# Patient Record
Sex: Female | Born: 2000 | Race: Black or African American | Hispanic: No | Marital: Single | Smoking: Current every day smoker
Health system: Southern US, Community
[De-identification: ages and names within clinical notes are randomized; demographics above are authoritative.]

## PROBLEM LIST (undated history)

## (undated) ENCOUNTER — Ambulatory Visit (HOSPITAL_COMMUNITY): Admission: EM | Source: Home / Self Care

## (undated) DIAGNOSIS — F319 Bipolar disorder, unspecified: Secondary | ICD-10-CM

## (undated) DIAGNOSIS — F250 Schizoaffective disorder, bipolar type: Secondary | ICD-10-CM

## (undated) DIAGNOSIS — F259 Schizoaffective disorder, unspecified: Secondary | ICD-10-CM

---
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
## 2021-03-16 ENCOUNTER — Ambulatory Visit
Admission: EM | Admit: 2021-03-16 | Discharge: 2021-03-16 | Disposition: A | Discharge status: Home / Self Care | Payer: Medicaid Other | Attending: Family Medicine | Admitting: Family Medicine

## 2021-03-16 DIAGNOSIS — Z1152 Encounter for screening for COVID-19: Secondary | ICD-10-CM | POA: Diagnosis not present

## 2021-03-16 NOTE — ED Triage Notes (Signed)
Pt presents today for Covid testing; denies symptoms.

## 2021-03-18 LAB — COVID-19, FLU A+B NAA
Influenza A, NAA: NOT DETECTED
Influenza B, NAA: NOT DETECTED
SARS-CoV-2, NAA: NOT DETECTED

## 2021-07-05 ENCOUNTER — Other Ambulatory Visit: Payer: Self-pay

## 2021-07-05 ENCOUNTER — Ambulatory Visit
Admission: EM | Admit: 2021-07-05 | Discharge: 2021-07-05 | Disposition: A | Discharge status: Home / Self Care | Payer: Medicaid Other | Attending: Family Medicine | Admitting: Family Medicine

## 2021-07-05 DIAGNOSIS — R112 Nausea with vomiting, unspecified: Secondary | ICD-10-CM | POA: Insufficient documentation

## 2021-07-05 DIAGNOSIS — Z1152 Encounter for screening for COVID-19: Secondary | ICD-10-CM | POA: Diagnosis not present

## 2021-07-05 DIAGNOSIS — R8290 Unspecified abnormal findings in urine: Secondary | ICD-10-CM | POA: Insufficient documentation

## 2021-07-05 DIAGNOSIS — J069 Acute upper respiratory infection, unspecified: Secondary | ICD-10-CM | POA: Insufficient documentation

## 2021-07-05 LAB — POCT URINALYSIS DIP (MANUAL ENTRY)
Blood, UA: NEGATIVE
Glucose, UA: NEGATIVE mg/dL
Ketones, POC UA: NEGATIVE mg/dL
Nitrite, UA: NEGATIVE
Protein Ur, POC: 100 mg/dL — AB
Spec Grav, UA: >=1.03 — AB (ref 1.010–1.025)
Urobilinogen, UA: 0.2 E.U./dL
pH, UA: 5.5 (ref 5.0–8.0)

## 2021-07-05 LAB — POCT URINE PREGNANCY: Preg Test, Ur: NEGATIVE

## 2021-07-05 MED ORDER — PROMETHAZINE-DM 6.25-15 MG/5ML PO SYRP: 5.0000 mL | ORAL_SOLUTION | Freq: Four times a day (QID) | ORAL | 0 refills | Status: DC | PRN

## 2021-07-05 MED ORDER — ONDANSETRON 4 MG PO TBDP: 4.0000 mg | ORAL_TABLET | Freq: Three times a day (TID) | ORAL | 0 refills | Status: DC | PRN

## 2021-07-05 NOTE — ED Provider Notes (Signed)
[facility]    CSN: [id number] Arrival date & time: 07/05/21  6301      History   Chief Complaint Chief Complaint  Patient presents with   Cough   Emesis    HPI Joan Charles is a 20 y.o. female.   Presenting today with 3-day history of cough, sore throat, generalized abdominal pain, nausea, vomiting, weakness, fever, chills.  Denies chest pain, shortness of breath, diarrhea, rashes.  Multiple sick contacts recently.  So far not taking anything over-the-counter for symptoms.  No pertinent chronic medical problems.   Past Medical History:  Diagnosis Date   Bipolar 1 disorder (HCC)    Schizo affective schizophrenia (HCC)     There are no problems to display for this patient.   History reviewed. No pertinent surgical history.  OB History   No obstetric history on file.      Home Medications    Prior to Admission medications   Medication Sig Start Date End Date Taking? Authorizing Provider  ondansetron (ZOFRAN-ODT) 4 MG disintegrating tablet Take 1 tablet (4 mg total) by mouth every 8 (eight) hours as needed for nausea or vomiting. 07/05/21  Yes Particia Nearing, PA-C  promethazine-dextromethorphan (PROMETHAZINE-DM) 6.25-15 MG/5ML syrup Take 5 mLs by mouth 4 (four) times daily as needed. 07/05/21  Yes Particia Nearing, PA-C    Family History History reviewed. No pertinent family history.  Social History Social History   Tobacco Use   Smoking status: Never   Smokeless tobacco: Never  Substance Use Topics   Alcohol use: No   Drug use: No     Allergies   Patient has no known allergies.   Review of Systems Review of Systems Per HPI  Physical Exam Triage Vital Signs ED Triage Vitals  Enc Vitals Group     BP 07/05/21 0931 118/80     Pulse Rate 07/05/21 0931 94     Resp 07/05/21 0931 20     Temp 07/05/21 0931 99.3 F (37.4 C)     Temp src --      SpO2 07/05/21 0931 98 %     Weight --      Height --      Head  Circumference --      Peak Flow --      Pain Score 07/05/21 0929 4     Pain Loc --      Pain Edu? --      Excl. in GC? --    No data found.  Updated Vital Signs BP 118/80   Pulse 94   Temp 99.3 F (37.4 C)   Resp 20   LMP 06/08/2021   SpO2 98%   Visual Acuity Right Eye Distance:   Left Eye Distance:   Bilateral Distance:    Right Eye Near:   Left Eye Near:    Bilateral Near:     Physical Exam Vitals and nursing note reviewed.  Constitutional:      Appearance: Normal appearance. She is not ill-appearing.  HENT:     Head: Atraumatic.     Nose: Nose normal.     Mouth/Throat:     Mouth: Mucous membranes are moist.     Pharynx: Oropharynx is clear. Posterior oropharyngeal erythema present.  Eyes:     Extraocular Movements: Extraocular movements intact.     Conjunctiva/sclera: Conjunctivae normal.  Cardiovascular:     Rate and Rhythm: Normal rate and regular rhythm.     Heart sounds: Normal heart sounds.  Pulmonary:     Effort: Pulmonary effort is normal.     Breath sounds: Normal breath sounds.  Abdominal:     General: Bowel sounds are normal. There is no distension.     Palpations: Abdomen is soft.     Tenderness: There is abdominal tenderness. There is no right CVA tenderness, left CVA tenderness or guarding.     Comments: Mild generalized tenderness to palpation diffusely without guarding or distention  Musculoskeletal:        General: Normal range of motion.     Cervical back: Normal range of motion and neck supple.  Skin:    General: Skin is warm and dry.  Neurological:     Mental Status: She is alert and oriented to person, place, and time.     Motor: No weakness.     Gait: Gait normal.  Psychiatric:        Mood and Affect: Mood normal.        Thought Content: Thought content normal.        Judgment: Judgment normal.   UC Treatments / Results  Labs (all labs ordered are listed, but only abnormal results are displayed) Labs Reviewed  POCT  URINALYSIS DIP (MANUAL ENTRY) - Abnormal; Notable for the following components:      Result Value   Clarity, UA cloudy (*)    Bilirubin, UA small (*)    Spec Grav, UA >=1.030 (*)    Protein Ur, POC =100 (*)    Leukocytes, UA Small (1+) (*)    All other components within normal limits  COVID-19, FLU A+B NAA  URINE CULTURE  POCT URINE PREGNANCY    EKG  Radiology No results found.  Procedures Procedures (including critical care time)  Medications Ordered in UC Medications - No data to display  Initial Impression / Assessment and Plan / UC Course  I have reviewed the triage vital signs and the nursing notes.  Pertinent labs & imaging results that were available during my care of the patient were reviewed by me and considered in my medical decision making (see chart for details).     Vital signs and exam reassuring, urinalysis without indication of a urinary tract infection more so reflecting dehydration to be expected with nausea and vomiting.  Suspect viral illness, likely influenza causing symptoms.  We will treat symptomatically with Zofran, Phenergan DM for cough, brat diet, push fluids while awaiting COVID and flu testing.  School note given and strict return precautions reviewed.  Final Clinical Impressions(s) / UC Diagnoses   Final diagnoses:  Abnormal urinalysis  Viral URI with cough  Nausea and vomiting, unspecified vomiting type   Discharge Instructions   None    ED Prescriptions     Medication Sig Dispense Auth. Provider   ondansetron (ZOFRAN-ODT) 4 MG disintegrating tablet Take 1 tablet (4 mg total) by mouth every 8 (eight) hours as needed for nausea or vomiting. 20 tablet Particia Nearing, [place]   promethazine-dextromethorphan (PROMETHAZINE-DM) 6.25-15 MG/5ML syrup Take 5 mLs by mouth 4 (four) times daily as needed. 100 mL Particia Nearing, [place]      PDMP not reviewed this encounter.   Particia Nearing, [place] 07/05/21 1037

## 2021-07-05 NOTE — ED Triage Notes (Signed)
Pt presents with cough, sore throat and vomiting that began on Friday

## 2021-07-06 LAB — COVID-19, FLU A+B NAA
Influenza A, NAA: NOT DETECTED
Influenza B, NAA: NOT DETECTED
SARS-CoV-2, NAA: NOT DETECTED

## 2021-07-06 LAB — URINE CULTURE: Culture: <10000 — AB

## 2021-10-06 ENCOUNTER — Ambulatory Visit
Admission: EM | Admit: 2021-10-06 | Discharge: 2021-10-06 | Disposition: A | Discharge status: Home / Self Care | Payer: Medicaid Other | Attending: Family Medicine | Admitting: Family Medicine

## 2021-10-06 ENCOUNTER — Encounter: Payer: Self-pay | Admitting: Emergency Medicine

## 2021-10-06 ENCOUNTER — Other Ambulatory Visit: Payer: Self-pay

## 2021-10-06 DIAGNOSIS — M25562 Pain in left knee: Secondary | ICD-10-CM

## 2021-10-06 MED ORDER — CYCLOBENZAPRINE HCL 5 MG PO TABS: 5.0000 mg | ORAL_TABLET | Freq: Three times a day (TID) | ORAL | 0 refills | Status: DC | PRN

## 2021-10-06 MED ORDER — NAPROXEN 500 MG PO TABS: 500.0000 mg | ORAL_TABLET | Freq: Two times a day (BID) | ORAL | 0 refills | Status: DC | PRN

## 2021-10-06 NOTE — ED Triage Notes (Signed)
Left leg/knee pain pain, unable to bend leg.  No known injury.  Pain since Sunday.   ?

## 2021-10-06 NOTE — ED Provider Notes (Signed)
?[facility] ? ? ? ?CSN: [id number] ?Arrival date & time: 10/06/21  0906 ? ? ?  ? ?History   ?Chief Complaint ?No chief complaint on file. ? ? ?HPI ?Joan Charles is a 21 y.o. female.  ? ?Presenting today with 3 to 4 days of left leg pain just above the knee.  Some pain radiating laterally toward back of knee as well.  States bending the leg is very painful and very painful to bear weight on the leg.  Denies swelling, redness, known injury to the area, numbness, tingling, weakness.  So far not tried anything over-the-counter for symptoms. ? ? ?Past Medical History:  ?Diagnosis Date  ? Bipolar 1 disorder (HCC)   ? Schizo affective schizophrenia (HCC)   ? ? ?There are no problems to display for this patient. ? ? ?History reviewed. No pertinent surgical history. ? ?OB History   ?No obstetric history on file. ?  ? ? ? ?Home Medications   ? ?Prior to Admission medications   ?Medication Sig Start Date End Date Taking? Authorizing Provider  ?cyclobenzaprine (FLEXERIL) 5 MG tablet Take 1 tablet (5 mg total) by mouth 3 (three) times daily as needed for muscle spasms. Do not drink alcohol or drive while taking this medication.  May cause drowsiness. 10/06/21  Yes Particia Nearing, PA-C  ?naproxen (NAPROSYN) 500 MG tablet Take 1 tablet (500 mg total) by mouth 2 (two) times daily as needed. 10/06/21  Yes Particia Nearing, PA-C  ?ondansetron (ZOFRAN-ODT) 4 MG disintegrating tablet Take 1 tablet (4 mg total) by mouth every 8 (eight) hours as needed for nausea or vomiting. 07/05/21   Particia Nearing, PA-C  ?promethazine-dextromethorphan (PROMETHAZINE-DM) 6.25-15 MG/5ML syrup Take 5 mLs by mouth 4 (four) times daily as needed. 07/05/21   Particia Nearing, PA-C  ? ? ?Family History ?History reviewed. No pertinent family history. ? ?Social History ?Social History  ? ?Tobacco Use  ? Smoking status: Every Day  ?  Types: Cigarettes  ? Smokeless tobacco: Never  ?Substance Use Topics  ? Alcohol use: No  ?  Drug use: No  ? ?Allergies   ?Patient has no known allergies. ? ?Review of Systems ?Review of Systems ?Per HPI ? ?Physical Exam ?Triage Vital Signs ?ED Triage Vitals  ?Enc Vitals Group  ?   BP 10/06/21 1210 119/78  ?   Pulse Rate 10/06/21 1210 98  ?   Resp 10/06/21 1210 18  ?   Temp 10/06/21 1210 98.1 ?F (36.7 ?C)  ?   Temp Source 10/06/21 1201 Oral  ?   SpO2 10/06/21 1210 98 %  ?   Weight --   ?   Height --   ?   Head Circumference --   ?   Peak Flow --   ?   Pain Score 10/06/21 1044 10  ?   Pain Loc --   ?   Pain Edu? --   ?   Excl. in GC? --   ? ?No data found. ? ?Updated Vital Signs ?BP 119/78 (BP Location: Right Arm)   Pulse 98   Temp 98.1 ?F (36.7 ?C) (Oral)   Resp 18   LMP 09/25/2021 (Exact Date)   SpO2 98%  ? ?Visual Acuity ?Right Eye Distance:   ?Left Eye Distance:   ?Bilateral Distance:   ? ?Right Eye Near:   ?Left Eye Near:    ?Bilateral Near:    ? ?Physical Exam ?Vitals and nursing note reviewed.  ?Constitutional:   ?  Appearance: Normal appearance. She is not ill-appearing.  ?HENT:  ?   Head: Atraumatic.  ?Eyes:  ?   Extraocular Movements: Extraocular movements intact.  ?   Conjunctiva/sclera: Conjunctivae normal.  ?Cardiovascular:  ?   Rate and Rhythm: Normal rate and regular rhythm.  ?   Heart sounds: Normal heart sounds.  ?Pulmonary:  ?   Effort: Pulmonary effort is normal.  ?   Breath sounds: Normal breath sounds.  ?Musculoskeletal:     ?   General: Tenderness present. No swelling, deformity or signs of injury. Normal range of motion.  ?   Cervical back: Normal range of motion and neck supple.  ?   Comments: Tender to palpation distal insertion of the quadricep muscles left leg.  Mild bilateral joint line tenderness to palpation but no swelling, deformity, crepitus.  Range of motion exam limited due to patient's discomfort.  No significant posterior knee tenderness, swelling and negative squeeze test, negative Denna Haggard' sign  ?Skin: ?   General: Skin is warm and dry.  ?   Findings: No bruising  or erythema.  ?Neurological:  ?   Mental Status: She is alert and oriented to person, place, and time.  ?   Comments: Left lower extremity neurovascularly intact  ?Psychiatric:     ?   Mood and Affect: Mood normal.     ?   Thought Content: Thought content normal.     ?   Judgment: Judgment normal.  ? ?UC Treatments / Results  ?Labs ?(all labs ordered are listed, but only abnormal results are displayed) ?Labs Reviewed - No data to display ? ?EKG ? ?Radiology ?No results found. ? ?Procedures ?Procedures (including critical care time) ? ?Medications Ordered in UC ?Medications - No data to display ? ?Initial Impression / Assessment and Plan / UC Course  ?I have reviewed the triage vital signs and the nursing notes. ? ?Pertinent labs & imaging results that were available during my care of the patient were reviewed by me and considered in my medical decision making (see chart for details). ? ?  ? ?Suspect muscular strain, treat with naproxen, Flexeril, compression sleeve to the knee and rest.  Work note and school note given.  Return for any acutely worsening symptoms. ? ?Final Clinical Impressions(s) / UC Diagnoses  ? ?Final diagnoses:  ?Acute pain of left knee  ? ?Discharge Instructions   ?None ?  ? ?ED Prescriptions   ? ? Medication Sig Dispense Auth. Provider  ? naproxen (NAPROSYN) 500 MG tablet Take 1 tablet (500 mg total) by mouth 2 (two) times daily as needed. 20 tablet Particia Nearing, [place]  ? cyclobenzaprine (FLEXERIL) 5 MG tablet Take 1 tablet (5 mg total) by mouth 3 (three) times daily as needed for muscle spasms. Do not drink alcohol or drive while taking this medication.  May cause drowsiness. 10 tablet Particia Nearing, [place]  ? ?  ? ?PDMP not reviewed this encounter. ?  ?Particia Nearing, PA-C ?10/06/21 1319 ? ?

## 2023-06-16 ENCOUNTER — Ambulatory Visit
Admission: EM | Admit: 2023-06-16 | Discharge: 2023-06-16 | Disposition: A | Discharge status: Home / Self Care | Payer: Medicaid Other | Attending: Nurse Practitioner | Admitting: Nurse Practitioner

## 2023-06-16 ENCOUNTER — Ambulatory Visit: Payer: Self-pay

## 2023-06-16 DIAGNOSIS — J029 Acute pharyngitis, unspecified: Secondary | ICD-10-CM | POA: Diagnosis not present

## 2023-06-16 DIAGNOSIS — B349 Viral infection, unspecified: Secondary | ICD-10-CM | POA: Diagnosis not present

## 2023-06-16 LAB — POCT RAPID STREP A (OFFICE): Rapid Strep A Screen: NEGATIVE

## 2023-06-16 MED ORDER — LIDOCAINE VISCOUS HCL 2 % MT SOLN: 15.0000 mL | OROMUCOSAL | 0 refills | Status: DC | PRN

## 2023-06-16 NOTE — ED Provider Notes (Signed)
[facility]    CSN: [id number] Arrival date & time: 06/16/23  3474      History   Chief Complaint No chief complaint on file.   HPI Joan Charles is a 22 y.o. female.   Patient presents today with 1 day history of nasal congestion, postnasal drainage, sore throat, headache, and left ear pain.  Also endorses fatigue.  No fever, cough, shortness of breath, chest pain, runny nose, ear drainage, decreased hearing from the ears, abdominal pain, nausea/vomiting, diarrhea.  No change in appetite or loss of taste/smell.  Reports her nephews are sick with similar symptoms.  Has not taken anything for symptoms so far.    Past Medical History:  Diagnosis Date   Bipolar 1 disorder (HCC)    Schizo affective schizophrenia (HCC)     There are no problems to display for this patient.   History reviewed. No pertinent surgical history.  OB History   No obstetric history on file.      Home Medications    Prior to Admission medications   Medication Sig Start Date End Date Taking? Authorizing Provider  lidocaine (XYLOCAINE) 2 % solution Use as directed 15 mLs in the mouth or throat as needed for mouth pain. 06/16/23  Yes Valentino Nose, NP  cyclobenzaprine (FLEXERIL) 5 MG tablet Take 1 tablet (5 mg total) by mouth 3 (three) times daily as needed for muscle spasms. Do not drink alcohol or drive while taking this medication.  May cause drowsiness. 10/06/21   Particia Nearing, PA-C  naproxen (NAPROSYN) 500 MG tablet Take 1 tablet (500 mg total) by mouth 2 (two) times daily as needed. 10/06/21   Particia Nearing, PA-C  ondansetron (ZOFRAN-ODT) 4 MG disintegrating tablet Take 1 tablet (4 mg total) by mouth every 8 (eight) hours as needed for nausea or vomiting. 07/05/21   Particia Nearing, PA-C    Family History History reviewed. No pertinent family history.  Social History Social History   Tobacco Use   Smoking status: Every Day    Types: Cigarettes    Smokeless tobacco: Never  Substance Use Topics   Alcohol use: No   Drug use: No     Allergies   Patient has no known allergies.   Review of Systems Review of Systems Per HPI  Physical Exam Triage Vital Signs ED Triage Vitals  Encounter Vitals Group     BP 06/16/23 1005 107/69     Systolic BP Percentile --      Diastolic BP Percentile --      Pulse Rate 06/16/23 1005 80     Resp 06/16/23 1005 14     Temp 06/16/23 1005 98.4 F (36.9 C)     Temp Source 06/16/23 1005 Oral     SpO2 06/16/23 1005 98 %     Weight --      Height --      Head Circumference --      Peak Flow --      Pain Score 06/16/23 1006 10     Pain Loc --      Pain Education --      Exclude from Growth Chart --    No data found.  Updated Vital Signs BP 107/69 (BP Location: Right Arm)   Pulse 80   Temp 98.4 F (36.9 C) (Oral)   Resp 14   LMP 06/10/2023 Comment: start  SpO2 98%   Visual Acuity Right Eye Distance:   Left Eye Distance:  Bilateral Distance:    Right Eye Near:   Left Eye Near:    Bilateral Near:     Physical Exam Vitals and nursing note reviewed.  Constitutional:      General: She is not in acute distress.    Appearance: Normal appearance. She is not ill-appearing or toxic-appearing.  HENT:     Head: Normocephalic and atraumatic.     Right Ear: Tympanic membrane, ear canal and external ear normal.     Left Ear: Tympanic membrane, ear canal and external ear normal.     Nose: No congestion or rhinorrhea.     Mouth/Throat:     Mouth: Mucous membranes are moist.     Pharynx: Oropharynx is clear. No oropharyngeal exudate or posterior oropharyngeal erythema.     Comments: Post nasal drainage Eyes:     General: No scleral icterus.    Extraocular Movements: Extraocular movements intact.  Cardiovascular:     Rate and Rhythm: Normal rate and regular rhythm.  Pulmonary:     Effort: Pulmonary effort is normal. No respiratory distress.     Breath sounds: Normal breath sounds. No  wheezing, rhonchi or rales.  Musculoskeletal:     Cervical back: Normal range of motion and neck supple.  Lymphadenopathy:     Cervical: No cervical adenopathy.  Skin:    General: Skin is warm and dry.     Coloration: Skin is not jaundiced or pale.     Findings: No erythema or rash.  Neurological:     Mental Status: She is alert and oriented to person, place, and time.  Psychiatric:        Behavior: Behavior is cooperative.      UC Treatments / Results  Labs (all labs ordered are listed, but only abnormal results are displayed) Labs Reviewed  POCT RAPID STREP A (OFFICE)    EKG   Radiology No results found.  Procedures Procedures (including critical care time)  Medications Ordered in UC Medications - No data to display  Initial Impression / Assessment and Plan / UC Course  I have reviewed the triage vital signs and the nursing notes.  Pertinent labs & imaging results that were available during my care of the patient were reviewed by me and considered in my medical decision making (see chart for details).   Patient is well-appearing, normotensive, afebrile, not tachycardic, not tachypneic, oxygenating well on room air.    1. Viral illness 2. Viral pharyngitis Rapid strep negative, throat culture deferred given exam Patient declines viral testing today Supportive care discussed, start lidocaine rinses, other supportive care Return and ER precautions discussed with patient  The patient was given the opportunity to ask questions.  All questions answered to their satisfaction.  The patient is in agreement to this plan.    Final Clinical Impressions(s) / UC Diagnoses   Final diagnoses:  Viral illness  Viral pharyngitis     Discharge Instructions      Rapid strep throat test is negative.  You have a viral upper respiratory infection.  Symptoms should improve over the next week to 10 days.  If you develop chest pain or shortness of breath, go to the emergency  room.  Some things that can make you feel better are: - Increased rest - Increasing fluid with water/sugar free electrolytes - Acetaminophen and ibuprofen as needed for fever/pain - Salt water gargling, chloraseptic spray and throat lozenges - OTC guaifenesin (Mucinex) 600 mg twice daily for congestion - Saline sinus flushes or a  neti pot for congestion - Humidifying the air - Lidocaine rinses for sore throat     ED Prescriptions     Medication Sig Dispense Auth. Provider   lidocaine (XYLOCAINE) 2 % solution Use as directed 15 mLs in the mouth or throat as needed for mouth pain. 100 mL Valentino Nose, NP      PDMP not reviewed this encounter.   Valentino Nose, NP 06/16/23 1025

## 2023-06-16 NOTE — Discharge Instructions (Signed)
Rapid strep throat test is negative.  You have a viral upper respiratory infection.  Symptoms should improve over the next week to 10 days.  If you develop chest pain or shortness of breath, go to the emergency room.  Some things that can make you feel better are: - Increased rest - Increasing fluid with water/sugar free electrolytes - Acetaminophen and ibuprofen as needed for fever/pain - Salt water gargling, chloraseptic spray and throat lozenges - OTC guaifenesin (Mucinex) 600 mg twice daily for congestion - Saline sinus flushes or a neti pot for congestion - Humidifying the air - Lidocaine rinses for sore throat

## 2023-06-16 NOTE — ED Triage Notes (Signed)
Pt report throat pai with mucus, nasal congestion, and left side ear pain x 1 day Hurts to swallow

## 2023-08-23 ENCOUNTER — Encounter: Payer: Self-pay | Admitting: Emergency Medicine

## 2023-08-23 ENCOUNTER — Ambulatory Visit
Admission: EM | Admit: 2023-08-23 | Discharge: 2023-08-23 | Disposition: A | Discharge status: Home / Self Care | Payer: Medicaid Other | Attending: Family Medicine | Admitting: Family Medicine

## 2023-08-23 DIAGNOSIS — N926 Irregular menstruation, unspecified: Secondary | ICD-10-CM | POA: Insufficient documentation

## 2023-08-23 DIAGNOSIS — N898 Other specified noninflammatory disorders of vagina: Secondary | ICD-10-CM | POA: Diagnosis present

## 2023-08-23 DIAGNOSIS — Z3202 Encounter for pregnancy test, result negative: Secondary | ICD-10-CM | POA: Insufficient documentation

## 2023-08-23 LAB — POCT URINALYSIS DIP (MANUAL ENTRY)
Bilirubin, UA: NEGATIVE
Blood, UA: NEGATIVE
Glucose, UA: NEGATIVE mg/dL
Ketones, POC UA: NEGATIVE mg/dL
Leukocytes, UA: NEGATIVE
Nitrite, UA: NEGATIVE
Protein Ur, POC: 30 mg/dL — AB
Spec Grav, UA: >=1.03 — AB (ref 1.010–1.025)
Urobilinogen, UA: 1 U/dL
pH, UA: 6.5 (ref 5.0–8.0)

## 2023-08-23 LAB — POCT URINE PREGNANCY: Preg Test, Ur: NEGATIVE

## 2023-08-23 NOTE — ED Provider Notes (Signed)
 [facility]    CSN: [id number] Arrival date & time: 08/23/23  1017      History   Chief Complaint No chief complaint on file.   HPI Joan Charles is a 23 y.o. female.   Patient presenting today for evaluation of missed menstrual cycle.  States her menstrual cycle was supposed to start 2 days ago but did not come.  States her periods are typically on time or 1 day early and she does not typically miss a menstrual cycle.  She is not currently on any birth control or hormonal supplementation and is sexually active.  Does have some vaginal discharge and cramping but otherwise no symptoms currently.      Past Medical History:  Diagnosis Date   Bipolar 1 disorder (HCC)    Schizo affective schizophrenia (HCC)     There are no active problems to display for this patient.   History reviewed. No pertinent surgical history.  OB History   No obstetric history on file.      Home Medications    Prior to Admission medications   Not on File    Family History History reviewed. No pertinent family history.  Social History Social History   Tobacco Use   Smoking status: Every Day    Types: Cigarettes   Smokeless tobacco: Never  Substance Use Topics   Alcohol use: No   Drug use: No     Allergies   Patient has no known allergies.   Review of Systems Review of Systems Per HPI  Physical Exam Triage Vital Signs ED Triage Vitals  Encounter Vitals Group     BP 08/23/23 1024 122/68     Systolic BP Percentile --      Diastolic BP Percentile --      Pulse Rate 08/23/23 1024 89     Resp 08/23/23 1024 18     Temp 08/23/23 1024 98.7 F (37.1 C)     Temp Source 08/23/23 1024 Oral     SpO2 08/23/23 1024 97 %     Weight --      Height --      Head Circumference --      Peak Flow --      Pain Score 08/23/23 1025 6     Pain Loc --      Pain Education --      Exclude from Growth Chart --    No data found.  Updated Vital Signs BP 122/68 (BP  Location: Right Arm)   Pulse 89   Temp 98.7 F (37.1 C) (Oral)   Resp 18   LMP 07/28/2023 (Exact Date)   SpO2 97%   Visual Acuity Right Eye Distance:   Left Eye Distance:   Bilateral Distance:    Right Eye Near:   Left Eye Near:    Bilateral Near:     Physical Exam Vitals and nursing note reviewed.  Constitutional:      Appearance: Normal appearance. She is not ill-appearing.  HENT:     Head: Atraumatic.  Eyes:     Extraocular Movements: Extraocular movements intact.     Conjunctiva/sclera: Conjunctivae normal.  Cardiovascular:     Rate and Rhythm: Normal rate and regular rhythm.     Heart sounds: Normal heart sounds.  Pulmonary:     Effort: Pulmonary effort is normal.     Breath sounds: Normal breath sounds.  Abdominal:     General: Bowel sounds are normal. There is no distension.  Palpations: Abdomen is soft.     Tenderness: There is no abdominal tenderness. There is no right CVA tenderness, left CVA tenderness or guarding.  Genitourinary:    Comments: GU exam deferred, self swab performed Musculoskeletal:        General: Normal range of motion.     Cervical back: Normal range of motion and neck supple.  Skin:    General: Skin is warm and dry.  Neurological:     Mental Status: She is alert and oriented to person, place, and time.  Psychiatric:        Mood and Affect: Mood normal.        Thought Content: Thought content normal.        Judgment: Judgment normal.      UC Treatments / Results  Labs (all labs ordered are listed, but only abnormal results are displayed) Labs Reviewed  POCT URINALYSIS DIP (MANUAL ENTRY) - Abnormal; Notable for the following components:      Result Value   Spec Grav, UA >=1.030 (*)    Protein Ur, POC =30 (*)    All other components within normal limits  POCT URINE PREGNANCY  CERVICOVAGINAL ANCILLARY ONLY    EKG   Radiology No results found.  Procedures Procedures (including critical care time)  Medications  Ordered in UC Medications - No data to display  Initial Impression / Assessment and Plan / UC Course  I have reviewed the triage vital signs and the nursing notes.  Pertinent labs & imaging results that were available during my care of the patient were reviewed by me and considered in my medical decision making (see chart for details).     Vitals and exam reassuring, urine pregnancy negative, urinalysis negative for UTI.  Vaginal swab pending for further evaluation of possible vaginal infections but did discuss that multiple factors, such as sleep, stress, recent illness can also affect menstrual cycle timing.  Follow-up with OB/GYN if not resolving.  Final Clinical Impressions(s) / UC Diagnoses   Final diagnoses:  Missed period  Vaginal discharge  Negative pregnancy test   Discharge Instructions   None    ED Prescriptions   None    PDMP not reviewed this encounter.   Corbin Dess, [place] 08/23/23 1132

## 2023-08-23 NOTE — ED Triage Notes (Signed)
 States she had a missed period and wants to be checked to see why it was missed.  States period was supposed to start on Monday.  States she is having some lower abd cramping.

## 2023-08-24 ENCOUNTER — Telehealth (HOSPITAL_COMMUNITY): Payer: Self-pay

## 2023-08-24 LAB — CERVICOVAGINAL ANCILLARY ONLY
Bacterial Vaginitis (gardnerella): POSITIVE — AB
Candida Glabrata: NEGATIVE
Candida Vaginitis: NEGATIVE
Comment: NEGATIVE
Comment: NEGATIVE
Comment: NEGATIVE

## 2023-08-24 MED ORDER — METRONIDAZOLE 500 MG PO TABS: 500.0000 mg | ORAL_TABLET | Freq: Two times a day (BID) | ORAL | 0 refills | Status: DC

## 2023-08-24 NOTE — Telephone Encounter (Signed)
 Per protocol, pt requires tx with metronidazole. Attempted to reach patient x1. LVM. Rx sent to pharmacy on file.

## 2023-12-28 ENCOUNTER — Encounter (HOSPITAL_COMMUNITY): Payer: Self-pay | Admitting: Emergency Medicine

## 2023-12-28 ENCOUNTER — Other Ambulatory Visit: Payer: Self-pay

## 2023-12-28 ENCOUNTER — Emergency Department (HOSPITAL_COMMUNITY)
Admission: EM | Admit: 2023-12-28 | Discharge: 2023-12-28 | Discharge status: Against Medical Advice | Attending: Emergency Medicine | Admitting: Emergency Medicine

## 2023-12-28 DIAGNOSIS — Z5321 Procedure and treatment not carried out due to patient leaving prior to being seen by health care provider: Secondary | ICD-10-CM | POA: Insufficient documentation

## 2023-12-28 DIAGNOSIS — K0889 Other specified disorders of teeth and supporting structures: Secondary | ICD-10-CM | POA: Diagnosis present

## 2023-12-28 NOTE — ED Triage Notes (Signed)
 Pt c/o right sided dental pain x a couple days.

## 2023-12-28 NOTE — ED Notes (Signed)
Patient stated she was leaving. 

## 2024-02-28 ENCOUNTER — Ambulatory Visit

## 2024-03-07 ENCOUNTER — Ambulatory Visit: Payer: Self-pay | Admitting: Family Medicine

## 2024-04-26 ENCOUNTER — Encounter (HOSPITAL_COMMUNITY): Payer: Self-pay

## 2024-04-26 ENCOUNTER — Ambulatory Visit (HOSPITAL_COMMUNITY)
Admission: EM | Admit: 2024-04-26 | Discharge: 2024-04-26 | Disposition: A | Discharge status: Home / Self Care | Attending: Emergency Medicine | Admitting: Emergency Medicine

## 2024-04-26 DIAGNOSIS — M25462 Effusion, left knee: Secondary | ICD-10-CM

## 2024-04-26 MED ORDER — PREDNISONE 20 MG PO TABS
40.0000 mg | ORAL_TABLET | Freq: Once | ORAL | Status: AC
Start: 2024-04-26 — End: 2024-04-26
  Administered 2024-04-26: 40 mg via ORAL

## 2024-04-26 MED ORDER — PREDNISONE 20 MG PO TABS
ORAL_TABLET | ORAL | Status: AC
  Filled 2024-04-26: qty 2

## 2024-04-26 MED ORDER — PREDNISONE 20 MG PO TABS: 40.0000 mg | ORAL_TABLET | Freq: Every day | ORAL | 0 refills | Status: AC

## 2024-04-26 NOTE — ED Provider Notes (Signed)
 [facility]    CSN: [id number] Arrival date & time: 04/26/24  1106      History   Chief Complaint Chief Complaint  Patient presents with   Joint Swelling    HPI Joan Charles is a 23 y.o. female.  Here with 2 day history of left knee pain, swelling Reports it started after she had bent her legs while sitting in bed No other injury, trauma, fall Used ibuprofen  and tylenol yesterday, no meds today  Denies prior knee injuries or issues  Past Medical History:  Diagnosis Date   Bipolar 1 disorder (HCC)    Schizo affective schizophrenia (HCC)     There are no active problems to display for this patient.   History reviewed. No pertinent surgical history.  OB History   No obstetric history on file.      Home Medications    Prior to Admission medications   Medication Sig Start Date End Date Taking? Authorizing Provider  predniSONE  (DELTASONE ) 20 MG tablet Take 2 tablets (40 mg total) by mouth daily with breakfast for 4 days. Start tomorrow on 04/27/2024 04/27/24 05/01/24 Yes Sherial Ebrahim, [place] RIGGERS    Family History History reviewed. No pertinent family history.  Social History Social History   Tobacco Use   Smoking status: Every Day    Types: Cigarettes   Smokeless tobacco: Never  Vaping Use   Vaping status: Never Used  Substance Use Topics   Alcohol use: No   Drug use: No     Allergies   Patient has no known allergies.   Review of Systems Review of Systems  As per HPI  Physical Exam Triage Vital Signs ED Triage Vitals [04/26/24 1130]  Encounter Vitals Group     BP 117/75     Girls Systolic BP Percentile      Girls Diastolic BP Percentile      Boys Systolic BP Percentile      Boys Diastolic BP Percentile      Pulse Rate 86     Resp 16     Temp 98.4 F (36.9 C)     Temp Source Oral     SpO2 96 %     Weight      Height      Head Circumference      Peak Flow      Pain Score 10     Pain Loc      Pain Education      Exclude  from Growth Chart    No data found.  Updated Vital Signs BP 117/75 (BP Location: Right Arm)   Pulse 86   Temp 98.4 F (36.9 C) (Oral)   Resp 16   LMP 04/21/2024 (Approximate)   SpO2 96%    Physical Exam Vitals and nursing note reviewed.  Constitutional:      General: She is not in acute distress. HENT:     Mouth/Throat:     Pharynx: Oropharynx is clear.  Cardiovascular:     Rate and Rhythm: Normal rate and regular rhythm.     Pulses: Normal pulses.     Heart sounds: Normal heart sounds.  Pulmonary:     Effort: Pulmonary effort is normal.     Breath sounds: Normal breath sounds.  Musculoskeletal:     Cervical back: Normal range of motion.     Right knee: Normal.     Left knee: Swelling and effusion present. No bony tenderness or crepitus. Normal range of motion. Tenderness present. Normal  pulse.     Comments: 2+ swelling left knee. No erythema or warmth. Good ROM despite pain, can extend fully and flex to 90 degrees. Flexion causes most pain. Generalized tenderness. No pain with patellar movement, no crepitus. Distal sensation intact. DP pulses 2+. Normal ROM of ankles. No posterior calf tenderness.   Skin:    Capillary Refill: Capillary refill takes less than 2 seconds.  Neurological:     Mental Status: She is alert and oriented to person, place, and time.     UC Treatments / Results  Labs (all labs ordered are listed, but only abnormal results are displayed) Labs Reviewed - No data to display  EKG  Radiology No results found.  Procedures Procedures   Medications Ordered in UC Medications  predniSONE  (DELTASONE ) tablet 40 mg (has no administration in time range)    Initial Impression / Assessment and Plan / UC Course  I have reviewed the triage vital signs and the nursing notes.  Pertinent labs & imaging results that were available during my care of the patient were reviewed by me and considered in my medical decision making (see chart for  details).  Left knee swelling, pain No direct injury or trauma to warrant xray imaging at this time Good ROM, no warmth or erythema, not concerned for infection at this time. Suspect more likely bursitis. Advised RICE therapy and pain control. Declines IM steroid in clinic. Will do 5 day prednisone  burst, first dose given today in clinic. Knee brace provided. Recommend to follow up with orthopedics if symptoms persist Agrees to plan, no questions  Final Clinical Impressions(s) / UC Diagnoses   Final diagnoses:  Swelling of left knee     Discharge Instructions      We gave you your first prednisone  dose in the clinic today.  Starting tomorrow, finish taking 2 pills daily for 4 more days in a row. Do not use any Advil /ibuprofen  while taking the prednisone . You CAN use tylenol  Apply ice for 20 minutes at a time, a few times daily Wear knee brace for compression and support. Elevate knee on a pillow (or several) to reduce inflammation.   Please follow-up with orthopedics if symptoms are persisting.     ED Prescriptions     Medication Sig Dispense Auth. Provider   predniSONE  (DELTASONE ) 20 MG tablet Take 2 tablets (40 mg total) by mouth daily with breakfast for 4 days. Start tomorrow on 04/27/2024 8 tablet Earsel Shouse, [place] RIGGERS      PDMP not reviewed this encounter.   Neely Cecena, [place], PA-C 04/26/24 1201

## 2024-04-26 NOTE — Discharge Instructions (Addendum)
 We gave you your first prednisone  dose in the clinic today.  Starting tomorrow, finish taking 2 pills daily for 4 more days in a row. Do not use any Advil /ibuprofen  while taking the prednisone . You CAN use tylenol  Apply ice for 20 minutes at a time, a few times daily Wear knee brace for compression and support. Elevate knee on a pillow (or several) to reduce inflammation.   Please follow-up with orthopedics if symptoms are persisting.

## 2024-04-26 NOTE — ED Triage Notes (Signed)
 Pt c/o lt knee swelling x2 days after having her legs crossed. Denies injury. States took tylenol and ibuprofen  with no relief.
